# Patient Record
Sex: Female | Born: 1980 | Hispanic: Yes | Marital: Married | Smoking: Never smoker
Health system: Southern US, Community
[De-identification: ages and names within clinical notes are randomized; demographics above are authoritative.]

## PROBLEM LIST (undated history)

## (undated) DIAGNOSIS — Z789 Other specified health status: Secondary | ICD-10-CM

## (undated) HISTORY — PX: NO PAST SURGERIES: SHX2092

---

## 2002-04-04 ENCOUNTER — Inpatient Hospital Stay (HOSPITAL_COMMUNITY): Admission: AD | Admit: 2002-04-04 | Discharge: 2002-04-04 | Payer: Self-pay | Admitting: *Deleted

## 2002-08-19 ENCOUNTER — Ambulatory Visit (HOSPITAL_COMMUNITY): Admission: RE | Admit: 2002-08-19 | Discharge: 2002-08-19 | Payer: Self-pay | Admitting: *Deleted

## 2002-09-10 ENCOUNTER — Ambulatory Visit (HOSPITAL_COMMUNITY): Admission: RE | Admit: 2002-09-10 | Discharge: 2002-09-10 | Payer: Self-pay | Admitting: *Deleted

## 2002-11-25 ENCOUNTER — Inpatient Hospital Stay (HOSPITAL_COMMUNITY): Admission: AD | Admit: 2002-11-25 | Discharge: 2002-11-27 | Payer: Self-pay | Admitting: *Deleted

## 2002-11-25 ENCOUNTER — Encounter (INDEPENDENT_AMBULATORY_CARE_PROVIDER_SITE_OTHER): Payer: Self-pay | Admitting: *Deleted

## 2010-02-10 ENCOUNTER — Ambulatory Visit (HOSPITAL_COMMUNITY): Admission: RE | Admit: 2010-02-10 | Discharge: 2010-02-10 | Payer: Self-pay | Admitting: Family Medicine

## 2010-07-16 ENCOUNTER — Ambulatory Visit: Payer: Self-pay | Admitting: Obstetrics & Gynecology

## 2010-07-22 ENCOUNTER — Ambulatory Visit: Payer: Self-pay | Admitting: Advanced Practice Midwife

## 2010-07-22 ENCOUNTER — Inpatient Hospital Stay (HOSPITAL_COMMUNITY): Admission: RE | Admit: 2010-07-22 | Discharge: 2010-07-24 | Payer: Self-pay | Admitting: Obstetrics & Gynecology

## 2011-02-03 LAB — CBC
HCT: 35.7 % — ABNORMAL LOW (ref 36.0–46.0)
Hemoglobin: 12.1 g/dL (ref 12.0–15.0)
MCH: 32.1 pg (ref 26.0–34.0)
MCHC: 33.8 g/dL (ref 30.0–36.0)
MCV: 94.9 fL (ref 78.0–100.0)
Platelets: 236 10*3/uL (ref 150–400)
RBC: 3.76 MIL/uL — ABNORMAL LOW (ref 3.87–5.11)
RDW: 15.1 % (ref 11.5–15.5)
WBC: 10.8 10*3/uL — ABNORMAL HIGH (ref 4.0–10.5)

## 2011-02-03 LAB — RPR: RPR Ser Ql: NONREACTIVE

## 2013-11-21 NOTE — L&D Delivery Note (Signed)
Attestation of Attending Supervision of Advanced Practitioner (CNM/NP): Evaluation and management procedures were performed by the Advanced Practitioner under my supervision and collaboration.  I have reviewed the Advanced Practitioner's note and chart, and I agree with the management and plan.  Alexandria Villa 09/17/2014 3:22 PM

## 2013-11-21 NOTE — L&D Delivery Note (Signed)
Delivery Note Once pt's contractions pattern improved, she progressed steadily to C/C/+3.  After a 2 contraction 2nd stage, at 0238 a viable female was delivered via  (Presentation: LOA ).  APGAR: 9/9 ; weight pending.   .   Placenta status: intact with 3Vcord:  with the following complications: none   Anesthesia: Epidural  Episiotomy: none Lacerations: none Suture Repair: n/a Est. Blood Loss (mL): 200  Mom to postpartum.  Baby to Couplet care / Skin to Skin.  Delivery by Kathleen LimeShaniquah [name], SNM under my direct supervision.  CRESENZO-DISHMAN,Kyona Chauncey 09/12/2014, 2:42 AM

## 2014-04-10 ENCOUNTER — Other Ambulatory Visit (HOSPITAL_COMMUNITY): Payer: Self-pay | Admitting: Nurse Practitioner

## 2014-04-10 DIAGNOSIS — Z0379 Encounter for other suspected maternal and fetal conditions ruled out: Secondary | ICD-10-CM

## 2014-04-10 LAB — OB RESULTS CONSOLE RPR: RPR: NONREACTIVE

## 2014-04-10 LAB — OB RESULTS CONSOLE GBS: STREP GROUP B AG: NEGATIVE

## 2014-04-10 LAB — OB RESULTS CONSOLE HEPATITIS B SURFACE ANTIGEN: Hepatitis B Surface Ag: NEGATIVE

## 2014-04-10 LAB — OB RESULTS CONSOLE GC/CHLAMYDIA
CHLAMYDIA, DNA PROBE: NEGATIVE
GC PROBE AMP, GENITAL: NEGATIVE

## 2014-04-10 LAB — OB RESULTS CONSOLE HIV ANTIBODY (ROUTINE TESTING): HIV: NONREACTIVE

## 2014-04-10 LAB — OB RESULTS CONSOLE RUBELLA ANTIBODY, IGM: RUBELLA: IMMUNE

## 2014-04-10 LAB — OB RESULTS CONSOLE ANTIBODY SCREEN: Antibody Screen: NEGATIVE

## 2014-04-16 ENCOUNTER — Ambulatory Visit (HOSPITAL_COMMUNITY)
Admission: RE | Admit: 2014-04-16 | Discharge: 2014-04-16 | Disposition: A | Discharge status: Home / Self Care | Payer: Medicaid Other | Source: Ambulatory Visit | Attending: Nurse Practitioner | Admitting: Nurse Practitioner

## 2014-04-16 DIAGNOSIS — Z0379 Encounter for other suspected maternal and fetal conditions ruled out: Secondary | ICD-10-CM | POA: Diagnosis not present

## 2014-04-16 DIAGNOSIS — O368990 Maternal care for other specified fetal problems, unspecified trimester, not applicable or unspecified: Secondary | ICD-10-CM | POA: Diagnosis present

## 2014-04-16 DIAGNOSIS — O43899 Other placental disorders, unspecified trimester: Secondary | ICD-10-CM | POA: Diagnosis present

## 2014-06-27 ENCOUNTER — Other Ambulatory Visit (HOSPITAL_COMMUNITY): Payer: Self-pay | Admitting: Nurse Practitioner

## 2014-06-27 DIAGNOSIS — O3663X1 Maternal care for excessive fetal growth, third trimester, fetus 1: Secondary | ICD-10-CM

## 2014-07-02 ENCOUNTER — Ambulatory Visit (HOSPITAL_COMMUNITY)
Admission: RE | Admit: 2014-07-02 | Discharge: 2014-07-02 | Disposition: A | Discharge status: Home / Self Care | Payer: Self-pay | Source: Ambulatory Visit | Attending: Nurse Practitioner | Admitting: Nurse Practitioner

## 2014-07-02 DIAGNOSIS — O3663X1 Maternal care for excessive fetal growth, third trimester, fetus 1: Secondary | ICD-10-CM

## 2014-07-02 DIAGNOSIS — Z3689 Encounter for other specified antenatal screening: Secondary | ICD-10-CM | POA: Insufficient documentation

## 2014-07-02 DIAGNOSIS — O3660X0 Maternal care for excessive fetal growth, unspecified trimester, not applicable or unspecified: Secondary | ICD-10-CM | POA: Insufficient documentation

## 2014-07-04 ENCOUNTER — Other Ambulatory Visit (HOSPITAL_COMMUNITY): Payer: Self-pay | Admitting: Nurse Practitioner

## 2014-07-04 DIAGNOSIS — Z0374 Encounter for suspected problem with fetal growth ruled out: Secondary | ICD-10-CM

## 2014-07-30 ENCOUNTER — Ambulatory Visit (HOSPITAL_COMMUNITY)
Admission: RE | Admit: 2014-07-30 | Discharge: 2014-07-30 | Disposition: A | Discharge status: Home / Self Care | Payer: Self-pay | Source: Ambulatory Visit | Attending: Nurse Practitioner | Admitting: Nurse Practitioner

## 2014-07-30 DIAGNOSIS — Z0374 Encounter for suspected problem with fetal growth ruled out: Secondary | ICD-10-CM | POA: Insufficient documentation

## 2014-07-30 DIAGNOSIS — O3660X0 Maternal care for excessive fetal growth, unspecified trimester, not applicable or unspecified: Secondary | ICD-10-CM | POA: Insufficient documentation

## 2014-07-30 DIAGNOSIS — O3663X1 Maternal care for excessive fetal growth, third trimester, fetus 1: Secondary | ICD-10-CM

## 2014-07-30 DIAGNOSIS — Z3689 Encounter for other specified antenatal screening: Secondary | ICD-10-CM | POA: Insufficient documentation

## 2014-08-13 ENCOUNTER — Ambulatory Visit (HOSPITAL_COMMUNITY): Payer: Self-pay

## 2014-08-25 LAB — OB RESULTS CONSOLE GC/CHLAMYDIA
Chlamydia: NEGATIVE
Gonorrhea: NEGATIVE

## 2014-08-25 LAB — OB RESULTS CONSOLE GBS: STREP GROUP B AG: NEGATIVE

## 2014-08-27 ENCOUNTER — Ambulatory Visit (HOSPITAL_COMMUNITY): Payer: MEDICAID

## 2014-09-11 ENCOUNTER — Inpatient Hospital Stay (HOSPITAL_COMMUNITY): Payer: Medicaid Other | Admitting: Anesthesiology

## 2014-09-11 ENCOUNTER — Encounter (HOSPITAL_COMMUNITY): Payer: Self-pay | Admitting: *Deleted

## 2014-09-11 ENCOUNTER — Inpatient Hospital Stay (HOSPITAL_COMMUNITY)
Admission: AD | Admit: 2014-09-11 | Discharge: 2014-09-13 | DRG: 775 | Disposition: A | Discharge status: Home / Self Care | Payer: Medicaid Other | Source: Ambulatory Visit | Attending: Obstetrics & Gynecology | Admitting: Obstetrics & Gynecology

## 2014-09-11 ENCOUNTER — Encounter (HOSPITAL_COMMUNITY): Payer: Self-pay

## 2014-09-11 ENCOUNTER — Inpatient Hospital Stay (HOSPITAL_COMMUNITY)
Admission: AD | Admit: 2014-09-11 | Discharge: 2014-09-11 | Disposition: A | Discharge status: Home / Self Care | Payer: Medicaid Other | Source: Ambulatory Visit | Attending: Obstetrics and Gynecology | Admitting: Obstetrics and Gynecology

## 2014-09-11 ENCOUNTER — Encounter (HOSPITAL_COMMUNITY): Payer: Medicaid Other | Admitting: Anesthesiology

## 2014-09-11 DIAGNOSIS — IMO0001 Reserved for inherently not codable concepts without codable children: Secondary | ICD-10-CM

## 2014-09-11 DIAGNOSIS — O3663X0 Maternal care for excessive fetal growth, third trimester, not applicable or unspecified: Principal | ICD-10-CM | POA: Diagnosis present

## 2014-09-11 DIAGNOSIS — Z833 Family history of diabetes mellitus: Secondary | ICD-10-CM

## 2014-09-11 DIAGNOSIS — Z3A39 39 weeks gestation of pregnancy: Secondary | ICD-10-CM | POA: Diagnosis present

## 2014-09-11 DIAGNOSIS — O471 False labor at or after 37 completed weeks of gestation: Secondary | ICD-10-CM | POA: Diagnosis present

## 2014-09-11 DIAGNOSIS — N898 Other specified noninflammatory disorders of vagina: Secondary | ICD-10-CM

## 2014-09-11 DIAGNOSIS — O26893 Other specified pregnancy related conditions, third trimester: Secondary | ICD-10-CM

## 2014-09-11 HISTORY — DX: Other specified health status: Z78.9

## 2014-09-11 LAB — CBC
HCT: 35.4 % — ABNORMAL LOW (ref 36.0–46.0)
Hemoglobin: 11.7 g/dL — ABNORMAL LOW (ref 12.0–15.0)
MCH: 30.1 pg (ref 26.0–34.0)
MCHC: 33.1 g/dL (ref 30.0–36.0)
MCV: 91 fL (ref 78.0–100.0)
PLATELETS: 269 10*3/uL (ref 150–400)
RBC: 3.89 MIL/uL (ref 3.87–5.11)
RDW: 14.4 % (ref 11.5–15.5)
WBC: 13.7 10*3/uL — ABNORMAL HIGH (ref 4.0–10.5)

## 2014-09-11 LAB — TYPE AND SCREEN
ABO/RH(D): A POS
Antibody Screen: NEGATIVE

## 2014-09-11 LAB — ABO/RH: ABO/RH(D): A POS

## 2014-09-11 LAB — AMNISURE RUPTURE OF MEMBRANE (ROM) NOT AT ARMC: Amnisure ROM: NEGATIVE

## 2014-09-11 MED ORDER — LACTATED RINGERS IV SOLN: 500.0000 mL | Freq: Once | INTRAVENOUS | Status: DC

## 2014-09-11 MED ORDER — EPHEDRINE 5 MG/ML INJ
10.0000 mg | INTRAVENOUS | Status: DC | PRN
  Filled 2014-09-11: qty 2

## 2014-09-11 MED ORDER — PHENYLEPHRINE 40 MCG/ML (10ML) SYRINGE FOR IV PUSH (FOR BLOOD PRESSURE SUPPORT)
80.0000 ug | PREFILLED_SYRINGE | INTRAVENOUS | Status: DC | PRN
  Filled 2014-09-11: qty 2

## 2014-09-11 MED ORDER — LACTATED RINGERS IV SOLN: 500.0000 mL | INTRAVENOUS | Status: DC | PRN

## 2014-09-11 MED ORDER — LIDOCAINE HCL (PF) 1 % IJ SOLN
30.0000 mL | INTRAMUSCULAR | Status: DC | PRN
  Filled 2014-09-11: qty 30

## 2014-09-11 MED ORDER — CITRIC ACID-SODIUM CITRATE 334-500 MG/5ML PO SOLN: 30.0000 mL | ORAL | Status: DC | PRN

## 2014-09-11 MED ORDER — ONDANSETRON HCL 4 MG/2ML IJ SOLN: 4.0000 mg | Freq: Four times a day (QID) | INTRAMUSCULAR | Status: DC | PRN

## 2014-09-11 MED ORDER — OXYTOCIN 40 UNITS IN LACTATED RINGERS INFUSION - SIMPLE MED
62.5000 mL/h | INTRAVENOUS | Status: DC
  Filled 2014-09-11: qty 1000

## 2014-09-11 MED ORDER — FENTANYL 2.5 MCG/ML BUPIVACAINE 1/10 % EPIDURAL INFUSION (WH - ANES)
INTRAMUSCULAR | Status: AC
  Filled 2014-09-11: qty 125

## 2014-09-11 MED ORDER — ACETAMINOPHEN 325 MG PO TABS: 650.0000 mg | ORAL_TABLET | ORAL | Status: DC | PRN

## 2014-09-11 MED ORDER — FLEET ENEMA 7-19 GM/118ML RE ENEM: 1.0000 | ENEMA | RECTAL | Status: DC | PRN

## 2014-09-11 MED ORDER — TERBUTALINE SULFATE 1 MG/ML IJ SOLN: 0.2500 mg | Freq: Once | INTRAMUSCULAR | Status: AC | PRN

## 2014-09-11 MED ORDER — PHENYLEPHRINE 40 MCG/ML (10ML) SYRINGE FOR IV PUSH (FOR BLOOD PRESSURE SUPPORT)
PREFILLED_SYRINGE | INTRAVENOUS | Status: AC
  Filled 2014-09-11: qty 10

## 2014-09-11 MED ORDER — OXYTOCIN BOLUS FROM INFUSION
500.0000 mL | INTRAVENOUS | Status: DC
  Administered 2014-09-12: 500 mL via INTRAVENOUS

## 2014-09-11 MED ORDER — OXYTOCIN 40 UNITS IN LACTATED RINGERS INFUSION - SIMPLE MED
1.0000 m[IU]/min | INTRAVENOUS | Status: DC
  Administered 2014-09-11: 2 m[IU]/min via INTRAVENOUS

## 2014-09-11 MED ORDER — DIPHENHYDRAMINE HCL 50 MG/ML IJ SOLN
12.5000 mg | INTRAMUSCULAR | Status: AC | PRN
  Administered 2014-09-11 – 2014-09-12 (×3): 12.5 mg via INTRAVENOUS
  Filled 2014-09-11: qty 1

## 2014-09-11 MED ORDER — FENTANYL 2.5 MCG/ML BUPIVACAINE 1/10 % EPIDURAL INFUSION (WH - ANES)
14.0000 mL/h | INTRAMUSCULAR | Status: DC | PRN
  Administered 2014-09-11: 14 mL/h via EPIDURAL

## 2014-09-11 MED ORDER — OXYCODONE-ACETAMINOPHEN 5-325 MG PO TABS: 1.0000 | ORAL_TABLET | ORAL | Status: DC | PRN

## 2014-09-11 MED ORDER — OXYCODONE-ACETAMINOPHEN 5-325 MG PO TABS: 2.0000 | ORAL_TABLET | ORAL | Status: DC | PRN

## 2014-09-11 MED ORDER — LACTATED RINGERS IV SOLN
INTRAVENOUS | Status: DC
  Administered 2014-09-11: 23:00:00 via INTRAVENOUS

## 2014-09-11 MED ORDER — LIDOCAINE HCL (PF) 1 % IJ SOLN
INTRAMUSCULAR | Status: DC | PRN
  Administered 2014-09-11 (×4): 4 mL

## 2014-09-11 NOTE — Discharge Instructions (Signed)
Contracciones de Braxton Hicks °(Braxton Hicks Contractions) °Durante el embarazo, pueden presentarse contracciones uterinas que no siempre indican que está en trabajo de parto.  °¿QUÉ SON LAS CONTRACCIONES DE BRAXTON HICKS?  °Las contracciones que se presentan antes del trabajo de parto se conocen como contracciones de Braxton Hicks o falso trabajo de parto. Hacia el final del embarazo (32 a 34 semanas), estas contracciones pueden aparecen con más frecuencia y volverse más intensas. No corresponden al trabajo de parto verdadero porque estas contracciones no producen el agrandamiento (la dilatación) y el afinamiento del cuello del útero. Algunas veces, es difícil distinguirlas del trabajo de parto verdadero porque en algunos casos pueden ser muy intensas, y las personas tienen diferentes niveles de tolerancia al dolor. No debe sentirse avergonzada si concurre al hospital con falso trabajo de parto. En ocasiones, la única forma de saber si el trabajo de parto es verdadero es que el médico determine si hay cambios en el cuello del útero. °Si no hay problemas prenatales u otras complicaciones de salud asociadas con el embarazo, no habrá inconvenientes si la envían a su casa con falso trabajo de parto y espera que comience el verdadero. °CÓMO DIFERENCIAR EL TRABAJO DE PARTO FALSO DEL VERDADERO °Falso trabajo de parto °· Las contracciones del falso trabajo de parto duran menos y no son tan intensas como las verdaderas. °· Generalmente son irregulares. °· A menudo, se sienten en la parte delantera de la parte baja del abdomen y en la ingle, °· y pueden desaparecer cuando camina o cambia de posición mientras está acostada. °· Las contracciones se vuelven más débiles y su duración es menor a medida que el tiempo transcurre. °· Por lo general, no se hacen progresivamente más intensas, regulares y cercanas entre sí como en el caso del trabajo de parto verdadero. °Verdadero trabajo de parto °· Las contracciones del verdadero  trabajo de parto duran de 30 a 70 segundos, son muy regulares y suelen volverse más intensas, y aumenta su frecuencia. °· No desaparecen cuando camina. °· La molestia generalmente se siente en la parte superior del útero y se extiende hacia la zona inferior del abdomen y hacia la cintura. °· El médico podrá examinarla para determinar si el trabajo de parto es verdadero. El examen mostrará si el cuello del útero se está dilatando y afinando. °LO QUE DEBE RECORDAR °· Continúe haciendo los ejercicios habituales y siga otras indicaciones que el médico le dé. °· Tome todos los medicamentos como le indicó el médico. °· Concurra a las visitas prenatales regulares. °· Coma y beba con moderación si cree que está en trabajo de parto. °· Si las contracciones de Braxton Hicks le provocan incomodidad: °¨ Cambie de posición: si está acostada o descansando, camine; si está caminando, descanse. °¨ Siéntese y descanse en una bañera con agua tibia. °¨ Beba 2 o 3 vasos de agua. La deshidratación puede provocar contracciones. °¨ Respire lenta y profundamente varias veces por hora. °¿CUÁNDO DEBO BUSCAR ASISTENCIA MÉDICA INMEDIATA? °Solicite atención médica de inmediato si: °· Las contracciones se intensifican, se hacen más regulares y cercanas entre sí. °· Tiene una pérdida de líquido por la vagina. °· Tiene fiebre. °· Elimina mucosidad manchada con sangre. °· Tiene una hemorragia vaginal abundante. °· Tiene dolor abdominal permanente. °· Tiene un dolor en la zona lumbar que nunca tuvo antes. °· Siente que la cabeza del bebé empuja hacia abajo y ejerce presión en la zona pélvica. °· El bebé no se mueve tanto como solía. °Document Released: 08/17/2005 Document Revised: 11/12/2013 °ExitCare® Patient   Information 2015 [place], [place]. This information is not intended to replace advice given to you by your health care provider. Make sure you discuss any questions you have with your health care provider.  Evaluacin de los movimientos  fetales  (Fetal Movement Counts) Nombre del paciente: __________________________________________________ Micheline ChapmanFecha de parto estimada: ____________________ Caroleen HammanLa evaluacin de los movimientos fetales es muy recomendable en los embarazos de alto riesgo, pero tambin es una buena idea que lo hagan todas las Horseshoe Bendembarazadas. El Firefightermdico le indicar que comience a contarlos a las 28 semanas de Elvastonembarazo. Los movimientos fetales suelen aumentar:   Despus de Animatoruna comida completa.  Despus de la actividad fsica.  Despus de comer o beber Graybar Electricalgo dulce o fro.  En reposo. Preste atencin cuando sienta que el beb est ms activo. Esto le ayudar a notar un patrn de ciclos de vigilia y sueo de su beb y cules son los factores que contribuyen a un aumento de los movimientos fetales. Es importante llevar a cabo un recuento de movimientos fetales, al mismo tiempo cada da, cuando el beb normalmente est ms activo.  CMO CONTAR LOS MOVIMIENTOS FETALES 1. Busque un lugar tranquilo y cmodo para sentarse o recostarse sobre el lado izquierdo. Al recostarse sobre su lado izquierdo, le proporciona una mejor circulacin de Roslynsangre y oxgeno al beb. 2. Anote el da y la hora en una hoja de papel o en un diario. 3. Comience contando las pataditas, revoloteos, chasquidos, vueltas o pinchazos en un perodo de 2 horas. Debe sentir al menos 10 movimientos en 2 horas. 4. Si no siente 10 movimientos en 2 horas, espere 2  3 horas y cuente de nuevo. Busque cambios en el patrn o si no cuenta lo suficiente en 2 horas. SOLICITE ATENCIN MDICA SI:   Siente menos de 10 pataditas en 2 horas, en dos intentos.  No hay movimientos durante una hora.  El patrn se modifica o le lleva ms tiempo Art gallery managercada da contar las 10 pataditas.  Siente que el beb no se mueve como lo hace habitualmente. Fecha: ____________ Movimientos: ____________ Stevan BornHora de inicio: ____________ Stevan BornHora de finalizacin: ____________  Franco NonesFecha: ____________ Movimientos:  ____________ Stevan BornHora de inicio: ____________ Stevan BornHora de finalizacin: ____________  Franco NonesFecha: ____________ Movimientos: ____________ Stevan BornHora de inicio: ____________ Stevan BornHora de finalizacin: ____________  Franco NonesFecha: ____________ Movimientos: ____________ Stevan BornHora de inicio: ____________ Stevan BornHora de finalizacin: ____________  Franco NonesFecha: ____________ Movimientos: ____________ Stevan BornHora de inicio: ____________ Mammie RussianHora de finalizacin: ____________  Franco NonesFecha: ____________ Movimientos: ____________ Mammie RussianHora de inicio: ____________ Mammie RussianHora de finalizacin: ____________  Franco NonesFecha: ____________ Movimientos: ____________ Mammie RussianHora de inicio: ____________ Mammie RussianHora de finalizacin: ____________  Franco NonesFecha: ____________ Movimientos: ____________ Mammie RussianHora de inicio: ____________ Mammie RussianHora de finalizacin: ____________  Franco NonesFecha: ____________ Movimientos: ____________ Mammie RussianHora de inicio: ____________ Mammie RussianHora de finalizacin: ____________  Franco NonesFecha: ____________ Movimientos: ____________ Mammie RussianHora de inicio: ____________ Mammie RussianHora de finalizacin: ____________  Franco NonesFecha: ____________ Movimientos: ____________ Mammie RussianHora de inicio: ____________ Mammie RussianHora de finalizacin: ____________  Franco NonesFecha: ____________ Movimientos: ____________ Mammie RussianHora de inicio: ____________ Mammie RussianHora de finalizacin: ____________  Franco NonesFecha: ____________ Movimientos: ____________ Mammie RussianHora de inicio: ____________ Mammie RussianHora de finalizacin: ____________  Franco NonesFecha: ____________ Movimientos: ____________ Mammie RussianHora de inicio: ____________ Mammie RussianHora de finalizacin: ____________  Franco NonesFecha: ____________ Movimientos: ____________ Mammie RussianHora de inicio: ____________ Mammie RussianHora de finalizacin: ____________  Franco NonesFecha: ____________ Movimientos: ____________ Mammie RussianHora de inicio: ____________ Mammie RussianHora de finalizacin: ____________  Franco NonesFecha: ____________ Movimientos: ____________ Mammie RussianHora de inicio: ____________ Mammie RussianHora de finalizacin: ____________  Franco NonesFecha: ____________ Movimientos: ____________ Stevan BornHora de inicio: ____________ Mammie RussianHora de finalizacin: ____________  Franco NonesFecha: ____________ Movimientos: ____________ Mammie RussianHora de inicio: ____________  Mammie RussianHora de finalizacin: ____________  Franco NonesFecha:  ____________ Movimientos: ____________ Stevan BornHora de inicio: ____________ Stevan BornHora de finalizacin: ____________  Franco NonesFecha: ____________ Movimientos: ____________ Stevan BornHora de inicio: ____________ Stevan BornHora de finalizacin: ____________  Franco NonesFecha: ____________ Movimientos: ____________ Stevan BornHora de inicio: ____________ Stevan BornHora de finalizacin: ____________  Franco NonesFecha: ____________ Movimientos: ____________ Stevan BornHora de inicio: ____________ Stevan BornHora de finalizacin: ____________  Franco NonesFecha: ____________ Movimientos: ____________ Stevan BornHora de inicio: ____________ Stevan BornHora de finalizacin: ____________  Franco NonesFecha: ____________ Movimientos: ____________ Stevan BornHora de inicio: ____________ Mammie RussianHora de finalizacin: ____________  Franco NonesFecha: ____________ Movimientos: ____________ Mammie RussianHora de inicio: ____________ Mammie RussianHora de finalizacin: ____________  Franco NonesFecha: ____________ Movimientos: ____________ Mammie RussianHora de inicio: ____________ Mammie RussianHora de finalizacin: ____________  Franco NonesFecha: ____________ Movimientos: ____________ Mammie RussianHora de inicio: ____________ Mammie RussianHora de finalizacin: ____________  Franco NonesFecha: ____________ Movimientos: ____________ Mammie RussianHora de inicio: ____________ Mammie RussianHora de finalizacin: ____________  Franco NonesFecha: ____________ Movimientos: ____________ Mammie RussianHora de inicio: ____________ Mammie RussianHora de finalizacin: ____________  Franco NonesFecha: ____________ Movimientos: ____________ Mammie RussianHora de inicio: ____________ Mammie RussianHora de finalizacin: ____________  Franco NonesFecha: ____________ Movimientos: ____________ Mammie RussianHora de inicio: ____________ Mammie RussianHora de finalizacin: ____________  Franco NonesFecha: ____________ Movimientos: ____________ Mammie RussianHora de inicio: ____________ Mammie RussianHora de finalizacin: ____________  Franco NonesFecha: ____________ Movimientos: ____________ Mammie RussianHora de inicio: ____________ Mammie RussianHora de finalizacin: ____________  Franco NonesFecha: ____________ Movimientos: ____________ Mammie RussianHora de inicio: ____________ Mammie RussianHora de finalizacin: ____________  Franco NonesFecha: ____________ Movimientos: ____________ Mammie RussianHora de inicio: ____________ Mammie RussianHora de finalizacin: ____________  Franco NonesFecha:  ____________ Movimientos: ____________ Mammie RussianHora de inicio: ____________ Mammie RussianHora de finalizacin: ____________  Franco NonesFecha: ____________ Movimientos: ____________ Mammie RussianHora de inicio: ____________ Mammie RussianHora de finalizacin: ____________  Franco NonesFecha: ____________ Movimientos: ____________ Mammie RussianHora de inicio: ____________ Mammie RussianHora de finalizacin: ____________  Franco NonesFecha: ____________ Movimientos: ____________ Mammie RussianHora de inicio: ____________ Mammie RussianHora de finalizacin: ____________  Franco NonesFecha: ____________ Movimientos: ____________ Mammie RussianHora de inicio: ____________ Mammie RussianHora de finalizacin: ____________  Franco NonesFecha: ____________ Movimientos: ____________ Mammie RussianHora de inicio: ____________ Mammie RussianHora de finalizacin: ____________  Franco NonesFecha: ____________ Movimientos: ____________ Mammie RussianHora de inicio: ____________ Mammie RussianHora de finalizacin: ____________  Franco NonesFecha: ____________ Movimientos: ____________ Mammie RussianHora de inicio: ____________ Mammie RussianHora de finalizacin: ____________  Franco NonesFecha: ____________ Movimientos: ____________ Mammie RussianHora de inicio: ____________ Mammie RussianHora de finalizacin: ____________  Franco NonesFecha: ____________ Movimientos: ____________ Mammie RussianHora de inicio: ____________ Mammie RussianHora de finalizacin: ____________  Franco NonesFecha: ____________ Movimientos: ____________ Mammie RussianHora de inicio: ____________ Mammie RussianHora de finalizacin: ____________  Franco NonesFecha: ____________ Movimientos: ____________ Mammie RussianHora de inicio: ____________ Mammie RussianHora de finalizacin: ____________  Franco NonesFecha: ____________ Movimientos: ____________ Mammie RussianHora de inicio: ____________ Mammie RussianHora de finalizacin: ____________  Franco NonesFecha: ____________ Movimientos: ____________ Mammie RussianHora de inicio: ____________ Mammie RussianHora de finalizacin: ____________  Franco NonesFecha: ____________ Movimientos: ____________ Mammie RussianHora de inicio: ____________ Mammie RussianHora de finalizacin: ____________  Franco NonesFecha: ____________ Movimientos: ____________ Mammie RussianHora de inicio: ____________ Mammie RussianHora de finalizacin: ____________  Franco NonesFecha: ____________ Movimientos: ____________ Mammie RussianHora de inicio: ____________ Mammie RussianHora de finalizacin: ____________  Franco NonesFecha: ____________ Movimientos: ____________ Mammie RussianHora  de inicio: ____________ Mammie RussianHora de finalizacin: ____________  Franco NonesFecha: ____________ Movimientos: ____________ Mammie RussianHora de inicio: ____________ Mammie RussianHora de finalizacin: ____________  Franco NonesFecha: ____________ Movimientos: ____________ Mammie RussianHora de inicio: ____________ Mammie RussianHora de finalizacin: ____________  Document Released: 02/14/2008 Document Revised: 10/24/2012 ExitCare Patient Information 2015 [place], [place]. This information is not intended to replace advice given to you by your health care provider. Make sure you discuss any questions you have with your health care provider.

## 2014-09-11 NOTE — Progress Notes (Signed)
Alexandria Villa is a 33 y.o. G3P2002 at 253w2d by ultrasound admitted for active labor  Subjective: Feeling well, no c/o offered.  Objective: BP 113/58  Pulse 67  Temp(Src) 98.6 F (37 C) (Oral)  Resp 18  Ht 5\' 1"  (1.549 m)  Wt 176 lb (79.833 kg)  BMI 33.27 kg/m2  SpO2 99%      FHT:  FHR: 140 bpm, variability: moderate,  accelerations:  Present,  decelerations:  Absent UC:   irregular, every 2-3 minutes SVE:   Dilation: 7 Effacement (%): 90 Station: -2 Exam by:: Rodena PietyFran Cresenzo, cnm AROM - blood-tinged fluid  Labs: Lab Results  Component Value Date   WBC 13.7* 09/11/2014   HGB 11.7* 09/11/2014   HCT 35.4* 09/11/2014   MCV 91.0 09/11/2014   PLT 269 09/11/2014    Assessment / Plan: Spontaneous labor, progressing normally  Labor: Progressing normally Preeclampsia:  no signs or symptoms of toxicity Fetal Wellbeing:  Category I Pain Control:  Labor support without medications I/D:  n/a Anticipated MOD:  NSVD  [name],[name] 09/11/2014, 9:40 PM  I have seen and examined this patient and agree the above assessment. CRESENZO-DISHMAN,Korayma Hagwood 09/11/2014 11:52 PM

## 2014-09-11 NOTE — Anesthesia Procedure Notes (Signed)
Epidural Patient location during procedure: OB Start time: 09/11/2014 7:33 PM  Staffing Anesthesiologist: Lenita Peregrina Performed by: anesthesiologist   Preanesthetic Checklist Completed: patient identified, site marked, surgical consent, pre-op evaluation, timeout performed, IV checked, risks and benefits discussed and monitors and equipment checked  Epidural Patient position: sitting Prep: site prepped and draped and DuraPrep Patient monitoring: continuous pulse ox and blood pressure Approach: midline Location: L3-L4 Injection technique: LOR air  Needle:  Needle type: Tuohy  Needle gauge: 17 G Needle length: 9 cm and 9 Needle insertion depth: 5 cm cm Catheter type: closed end flexible Catheter size: 19 Gauge Catheter at skin depth: 10 cm Test dose: negative  Assessment Events: blood not aspirated, injection not painful, no injection resistance, negative IV test and no paresthesia  Additional Notes Discussed risk of headache, infection, bleeding, nerve injury and failed or incomplete block.  Patient voices understanding and wishes to proceed.  Epidural placed easily on first attempt.  No paresthesia.  Patient tolerated procedure well with no apparent complications.  Alexandria DecemberA> Alexandria Villa, MDReason for block:procedure for pain

## 2014-09-11 NOTE — MAU Note (Signed)
Patient states she had leaking of clear fluid at 0730 this am prior to urinating but has not had any since that time. States she has been having irregular contractions and reports good fetal movement. [facility], fern was negative and sent to MAU for amnisure.

## 2014-09-11 NOTE — Progress Notes (Signed)
   Charlies Silversrika Hernandez-Perez is a 33 y.o. G3P2002 at 526w2d  admitted for active labor  Subjective: Comfortable with epidural  Objective: Filed Vitals:   09/11/14 2100 09/11/14 2130 09/11/14 2200 09/11/14 2230  BP: 113/58 111/60 99/57 117/68  Pulse: 67 83 78 75  Temp:  98.2 F (36.8 C)    TempSrc:  Oral    Resp:      Height:      Weight:      SpO2:          FHT:  FHR: 120 bpm, variability: moderate,  accelerations:  Present,  decelerations:  Absent UC:   irregular, every 2-5 minutes SVE:   7/90/-2, no change since AROM Labs: Lab Results  Component Value Date   WBC 13.7* 09/11/2014   HGB 11.7* 09/11/2014   HCT 35.4* 09/11/2014   MCV 91.0 09/11/2014   PLT 269 09/11/2014    Assessment / Plan: Protracted active phase, will augment with pitocin  Labor: stalled out Fetal Wellbeing:  Category I Pain Control:  Epidural Anticipated MOD:  NSVD  CRESENZO-DISHMAN,Amadea Keagy 09/11/2014, 11:09 PM

## 2014-09-11 NOTE — MAU Note (Signed)
Pt states contractions started getting stronger around 4pm. Pt states she has been having contractions all day

## 2014-09-11 NOTE — H&P (Signed)
Alexandria Villa is a 33 y.o. female presenting for contractions for the past 5-6 hours. She was evaluated for LOF earlier today; however fern and amnisure were negative at that time. +FM, denies VB. Would like epidural.   Pt plans to breast and bottle feed. Would like IUD for bc.  Maternal Medical History:  Reason for admission: Contractions.  Nausea.  Contractions: Onset was 3-5 hours ago.   Frequency: regular.   Perceived severity is moderate.    Fetal activity: Perceived fetal activity is normal.   Last perceived fetal movement was within the past hour.     Prenatal Complications --US Large for dates  OB History   Grav Para Term Preterm Abortions TAB SAB Ect Mult Living   3 2 2       2      Past Medical History  Diagnosis Date  . Medical history non-contributory    Past Surgical History  Procedure Laterality Date  . No past surgeries     Family History: family history includes Diabetes in her mother. Social History:  reports that she has never smoked. She does not have any smokeless tobacco history on file. She reports that she does not drink alcohol or use illicit drugs.   Prenatal Transfer Tool  Maternal Diabetes: No Genetic Screening: Normal Maternal Ultrasounds/Referrals: Abnormal:  Findings:   Other: Large for GA Fetal Ultrasounds or other Referrals:  None Maternal Substance Abuse:  No Significant Maternal Medications:  None Significant Maternal Lab Results:  None Other Comments:  None  Review of Systems  Constitutional: Negative for fever and chills.  HENT: Negative for tinnitus.   Eyes: Negative for blurred vision.  Respiratory: Negative for cough.   Cardiovascular: Negative for chest pain and palpitations.  Gastrointestinal: Negative for nausea, vomiting and diarrhea.  Genitourinary: Negative for dysuria.  Skin: Negative for rash.  Neurological: Negative for dizziness and headaches.  Psychiatric/Behavioral: Negative for depression and substance  abuse.     Blood pressure 117/62, pulse 73, temperature 98.9 F (37.2 C), temperature source Oral, resp. rate 18, SpO2 99.00%. Maternal Exam:  Uterine Assessment: Contraction strength is moderate.  Contraction frequency is regular.   Abdomen: Fetal presentation: vertex  Cervix: Cervix evaluated by digital exam.     Fetal Exam Fetal Monitor Review: Baseline rate: 140.  Variability: moderate (6-25 bpm).   Pattern: accelerations present and no decelerations.    Fetal State Assessment: Category I - tracings are normal.     Physical Exam  Constitutional: She is oriented to person, place, and time. She appears well-developed and well-nourished.  HENT:  Head: Normocephalic.  Eyes: EOM are normal. Pupils are equal, round, and reactive to light.  Neck: No thyromegaly present.  Cardiovascular: Normal rate.   Respiratory: Effort normal.  Lymphadenopathy:    She has no cervical adenopathy.  Neurological: She is alert and oriented to person, place, and time. She has normal reflexes.  Skin: Skin is warm.  Psychiatric: She has a normal mood and affect.    Dilation: 7 Effacement (%): 80 Cervical Position: Middle Station: -2 Presentation: Vertex Exam by:: Gwen PoundsKari Koch, MD   Prenatal labs: ABO, Rh:  A Pos  Antibody:  Negative  Rubella:  Immune RPR:   Negative HBsAg:   Negative HIV:   Negative GBS:   Negative  Assessment/Plan: Patient is 33 y.o. [id number] 3934w2d who presents in active labor.  #Active labor of term gestation --Admit to birthing suites on L&D --IV access --Epidural if desired --Thin clear diet --Continuous FHR monitoring --  Possible LGA    Aldona BarKoch, Kari L 09/11/2014, 6:34 PM  I was consulted RE: POC and agree with above.  PorcupineVirginia Ladd Cen, Pennsylvania[place]ode IslandCNM 09/11/2014 8:17 PM

## 2014-09-11 NOTE — MAU Provider Note (Signed)
  History     CSN: [id number]  Arrival date and time: 09/11/14 1022   First Provider Initiated Contact with Patient 09/11/14 1208      Chief Complaint  Patient presents with  . Labor Eval   HPI Alexandria Villa is a 33 y.o. [id number] @ 6044w2d gestation who presents to the MAU for possible ROM. She states she started leaking fluid this morning about 7:30. [facility] Fern test and was sent to MAU for Amnisure. She complains of irregular contractions.   OB History   Grav Para Term Preterm Abortions TAB SAB Ect Mult Living   3 2 2       2       Past Medical History  Diagnosis Date  . Medical history non-contributory     Past Surgical History  Procedure Laterality Date  . No past surgeries      Family History  Problem Relation Age of Onset  . Diabetes Mother     History  Substance Use Topics  . Smoking status: Never Smoker   . Smokeless tobacco: Not on file  . Alcohol Use: No    Allergies: No Known Allergies  Prescriptions prior to admission  Medication Sig Dispense Refill  . Prenatal Vit-Fe Fumarate-FA (PRENATAL MULTIVITAMIN) TABS tablet Take 1 tablet by mouth daily at 12 noon.        Review of Systems  Genitourinary:       Leaking fluid, contractions  All other systems negative  Blood pressure 109/65, pulse 75, temperature 98.9 F (37.2 C), temperature source Oral, resp. rate 18, height 5' 2.25" (1.581 m), weight 176 lb (79.833 kg), SpO2 99.00%.  Physical Exam  Constitutional: She is oriented to person, place, and time. She appears well-developed and well-nourished. No distress.  HENT:  Head: Normocephalic and atraumatic.  Eyes: Conjunctivae and EOM are normal.  Neck: Neck supple.  Cardiovascular: Normal rate.   Respiratory: Effort normal.  GI: Soft. There is no tenderness.  Gravid consistent with dates  Genitourinary:  Dilation: 5 Effacement (%): 70 Station: -2 Presentation: Vertex Exam by:: Morrison Oldee Carter  RN   Musculoskeletal: Normal range of motion.  Neurological: She is alert and oriented to person, place, and time.  Skin: Skin is warm and dry.  Psychiatric: She has a normal mood and affect. Her behavior is normal.   Results for orders placed during the hospital encounter of 09/11/14 (from the past 24 hour(s))  AMNISURE RUPTURE OF MEMBRANE (ROM)     Status: None   Collection Time    09/11/14 12:05 PM      Result Value Ref Range   Amnisure ROM NEGATIVE      MAU Course  Procedures EFM tracing reviewed by Ivonne AndrewV. Smith CNM, Amni sure negative. Patient informed of results and plan of care.   Assessment and Plan  33 y.o. 2044w2d here for evaluation of possible rupture of membranes. Stable for discharge without ROM. Patient informed of results and plan of care.   Alexandria Villa 09/11/2014, 12:10 PM

## 2014-09-11 NOTE — MAU Note (Signed)
Patient states she is having contractions every 5 minutes with spotting. No leaking. Reports good fetal movement.

## 2014-09-11 NOTE — MAU Provider Note (Signed)
Attestation of Attending Supervision of Advanced Practitioner (CNM/NP): Evaluation and management procedures were performed by the Advanced Practitioner under my supervision and collaboration.  I have reviewed the Advanced Practitioner's note and chart, and I agree with the management and plan.  Pama Roskos 09/11/2014 2:26 PM

## 2014-09-11 NOTE — Progress Notes (Signed)
FHR 120's, reactive. Reviewed by Dorathy KinsmanVirginia Ariyel Jeangilles, CNM

## 2014-09-11 NOTE — Anesthesia Preprocedure Evaluation (Signed)
Anesthesia Evaluation  Patient identified by MRN, date of birth, ID band Patient awake    Reviewed: Allergy & Precautions, H&P , NPO status , Patient's Chart, lab work & pertinent test results, reviewed documented beta blocker date and time   History of Anesthesia Complications Negative for: history of anesthetic complications  Airway Mallampati: II TM Distance: >3 FB Neck ROM: full    Dental  (+) Loose Some loose molars:   Pulmonary neg pulmonary ROS,  breath sounds clear to auscultation        Cardiovascular negative cardio ROS  Rhythm:regular Rate:Normal     Neuro/Psych negative neurological ROS  negative psych ROS   GI/Hepatic negative GI ROS, Neg liver ROS,   Endo/Other  negative endocrine ROS  Renal/GU negative Renal ROS     Musculoskeletal   Abdominal   Peds  Hematology negative hematology ROS (+)   Anesthesia Other Findings   Reproductive/Obstetrics (+) Pregnancy                           Anesthesia Physical Anesthesia Plan  ASA: II  Anesthesia Plan: Epidural   Post-op Pain Management:    Induction:   Airway Management Planned:   Additional Equipment:   Intra-op Plan:   Post-operative Plan:   Informed Consent: I have reviewed the patients History and Physical, chart, labs and discussed the procedure including the risks, benefits and alternatives for the proposed anesthesia with the patient or authorized representative who has indicated his/her understanding and acceptance.     Plan Discussed with:   Anesthesia Plan Comments:         Anesthesia Quick Evaluation

## 2014-09-11 NOTE — H&P (Signed)
Attestation of Attending Supervision of Advanced Practitioner (PA/CNM/NP): Evaluation and management procedures were performed by the Advanced Practitioner under my supervision and collaboration.  I have reviewed the Advanced Practitioner's note and chart, and I agree with the management and plan.  Mahsa Hanser, MD, FACOG Attending Obstetrician & Gynecologist Faculty Practice, [facility]   

## 2014-09-12 ENCOUNTER — Encounter (HOSPITAL_COMMUNITY): Payer: Self-pay | Admitting: *Deleted

## 2014-09-12 DIAGNOSIS — Z3A39 39 weeks gestation of pregnancy: Secondary | ICD-10-CM

## 2014-09-12 LAB — RPR

## 2014-09-12 MED ORDER — IBUPROFEN 600 MG PO TABS
600.0000 mg | ORAL_TABLET | Freq: Four times a day (QID) | ORAL | Status: DC
  Administered 2014-09-12 – 2014-09-13 (×6): 600 mg via ORAL
  Filled 2014-09-12 (×6): qty 1

## 2014-09-12 MED ORDER — OXYCODONE-ACETAMINOPHEN 5-325 MG PO TABS: 1.0000 | ORAL_TABLET | ORAL | Status: DC | PRN

## 2014-09-12 MED ORDER — ONDANSETRON HCL 4 MG PO TABS: 4.0000 mg | ORAL_TABLET | ORAL | Status: DC | PRN

## 2014-09-12 MED ORDER — SIMETHICONE 80 MG PO CHEW: 80.0000 mg | CHEWABLE_TABLET | ORAL | Status: DC | PRN

## 2014-09-12 MED ORDER — MEASLES, MUMPS & RUBELLA VAC ~~LOC~~ INJ
0.5000 mL | INJECTION | Freq: Once | SUBCUTANEOUS | Status: DC
  Filled 2014-09-12: qty 0.5

## 2014-09-12 MED ORDER — BISACODYL 10 MG RE SUPP
10.0000 mg | Freq: Every day | RECTAL | Status: DC | PRN
Start: 2014-09-12 — End: 2014-09-13

## 2014-09-12 MED ORDER — ZOLPIDEM TARTRATE 5 MG PO TABS: 5.0000 mg | ORAL_TABLET | Freq: Every evening | ORAL | Status: DC | PRN

## 2014-09-12 MED ORDER — FLEET ENEMA 7-19 GM/118ML RE ENEM: 1.0000 | ENEMA | Freq: Every day | RECTAL | Status: DC | PRN

## 2014-09-12 MED ORDER — OXYTOCIN 40 UNITS IN LACTATED RINGERS INFUSION - SIMPLE MED: 62.5000 mL/h | INTRAVENOUS | Status: DC | PRN

## 2014-09-12 MED ORDER — DIBUCAINE 1 % RE OINT: 1.0000 "application " | TOPICAL_OINTMENT | RECTAL | Status: DC | PRN

## 2014-09-12 MED ORDER — SENNOSIDES-DOCUSATE SODIUM 8.6-50 MG PO TABS
2.0000 | ORAL_TABLET | ORAL | Status: DC
  Administered 2014-09-13: 2 via ORAL
  Filled 2014-09-12: qty 2

## 2014-09-12 MED ORDER — TETANUS-DIPHTH-ACELL PERTUSSIS 5-2.5-18.5 LF-MCG/0.5 IM SUSP: 0.5000 mL | Freq: Once | INTRAMUSCULAR | Status: DC

## 2014-09-12 MED ORDER — BENZOCAINE-MENTHOL 20-0.5 % EX AERO: 1.0000 "application " | INHALATION_SPRAY | CUTANEOUS | Status: DC | PRN

## 2014-09-12 MED ORDER — WITCH HAZEL-GLYCERIN EX PADS: 1.0000 "application " | MEDICATED_PAD | CUTANEOUS | Status: DC | PRN

## 2014-09-12 MED ORDER — METHYLERGONOVINE MALEATE 0.2 MG/ML IJ SOLN: 0.2000 mg | INTRAMUSCULAR | Status: DC | PRN

## 2014-09-12 MED ORDER — ONDANSETRON HCL 4 MG/2ML IJ SOLN: 4.0000 mg | INTRAMUSCULAR | Status: DC | PRN

## 2014-09-12 MED ORDER — DIPHENHYDRAMINE HCL 25 MG PO CAPS: 25.0000 mg | ORAL_CAPSULE | Freq: Four times a day (QID) | ORAL | Status: DC | PRN

## 2014-09-12 MED ORDER — FERROUS SULFATE 325 (65 FE) MG PO TABS
325.0000 mg | ORAL_TABLET | Freq: Two times a day (BID) | ORAL | Status: DC
  Administered 2014-09-12 – 2014-09-13 (×3): 325 mg via ORAL
  Filled 2014-09-12 (×3): qty 1

## 2014-09-12 MED ORDER — PRENATAL MULTIVITAMIN CH
1.0000 | ORAL_TABLET | Freq: Every day | ORAL | Status: DC
  Administered 2014-09-12 – 2014-09-13 (×2): 1 via ORAL
  Filled 2014-09-12 (×2): qty 1

## 2014-09-12 MED ORDER — METHYLERGONOVINE MALEATE 0.2 MG PO TABS: 0.2000 mg | ORAL_TABLET | ORAL | Status: DC | PRN

## 2014-09-12 MED ORDER — OXYCODONE-ACETAMINOPHEN 5-325 MG PO TABS: 2.0000 | ORAL_TABLET | ORAL | Status: DC | PRN

## 2014-09-12 MED ORDER — LANOLIN HYDROUS EX OINT: TOPICAL_OINTMENT | CUTANEOUS | Status: DC | PRN

## 2014-09-12 NOTE — Lactation Note (Signed)
This note was copied from the chart of Alexandria Villa. Lactation Consultation Note     Initial consult with this mom of a term baby, now 6712 hours old, and full term. This is mom's third time breast feeding. She was asking for formula, and saying the baby would not feed. The baby was dressed in sleeper, wrapped in 2 blankets and a polartec blanket, with a hat that was tied under his neck. i explained LEAD to mom, and the benefits of sin to skin, and assisted mom latching the baby. He latched well, with strong suckles.  Mom said her mom was the reason for al the heavy blankets.Lactation folder reviewed with mom. Mom knows to call with questions/concerns.  Patient Name: Alexandria Villa [id number]'s Date: 09/12/2014 Reason for consult: Initial assessment   Maternal Data Formula Feeding for Exclusion: No Has patient been taught Hand Expression?: Yes Does the patient have breastfeeding experience prior to this delivery?: Yes  Feeding Feeding Type: Breast Fed  LATCH Score/Interventions Latch: Grasps breast easily, tongue down, lips flanged, rhythmical sucking.  Audible Swallowing: A few with stimulation Intervention(s): Skin to skin;Hand expression  Type of Nipple: Everted at rest and after stimulation  Comfort (Breast/Nipple): Soft / non-tender     Hold (Positioning): Assistance needed to correctly position infant at breast and maintain latch. Intervention(s): Breastfeeding basics reviewed;Support Pillows;Position options  LATCH Score: 8  Lactation Tools Discussed/Used     Consult Status Consult Status: PRN Date: 09/13/14 Follow-up type: In-patient    Alfred LevinsLee, Joretta Eads Anne 09/12/2014, 2:59 PM

## 2014-09-12 NOTE — Progress Notes (Signed)
UR completed 

## 2014-09-12 NOTE — Anesthesia Postprocedure Evaluation (Signed)
  Anesthesia Post-op Note  Patient: Alexandria Villa  Procedure(s) Performed: * No procedures listed *  Patient Location: [facility]  Anesthesia Type:Epidural  Level of Consciousness: awake and alert   Airway and Oxygen Therapy: Patient Spontanous Breathing  Post-op Pain: mild  Post-op Assessment: Post-op Vital signs reviewed, Patient's Cardiovascular Status Stable, Respiratory Function Stable, No signs of Nausea or vomiting, Pain level controlled, No headache, No residual numbness and No residual motor weakness  Post-op Vital Signs: Reviewed  Last Vitals:  Filed Vitals:   09/12/14 0530  BP: 120/64  Pulse: 73  Temp: 36.6 C  Resp: 18    Complications: No apparent anesthesia complications

## 2014-09-12 NOTE — Plan of Care (Signed)
Problem: Phase II Progression Outcomes Goal: Progress activity as tolerated unless otherwise ordered Outcome: Completed/Met Date Met:  09/12/14 Tolerates ambulating well

## 2014-09-13 MED ORDER — IBUPROFEN 600 MG PO TABS: 600.0000 mg | ORAL_TABLET | Freq: Four times a day (QID) | ORAL | Status: AC | PRN

## 2014-09-13 NOTE — Discharge Instructions (Signed)
Postpartum Care After Vaginal Delivery  °After you deliver your newborn (postpartum period), the usual stay in the hospital is 24-72 hours. If there were problems with your labor or delivery, or if you have other medical problems, you might be in the hospital longer.  °While you are in the hospital, you will receive help and instructions on how to care for yourself and your newborn during the postpartum period.  °While you are in the hospital:  °Be sure to tell your nurses if you have pain or discomfort, as well as where you feel the pain and what makes the pain worse.  °If you had an incision made near your vagina (episiotomy) or if you had some tearing during delivery, the nurses may put ice packs on your episiotomy or tear. The ice packs may help to reduce the pain and swelling.  °If you are breastfeeding, you may feel uncomfortable contractions of your uterus for a couple of weeks. This is normal. The contractions help your uterus get back to normal size.  °It is normal to have some bleeding after delivery.  °For the first 1-3 days after delivery, the flow is red and the amount may be similar to a period.  °It is common for the flow to start and stop.  °In the first few days, you may pass some small clots. Let your nurses know if you begin to pass large clots or your flow increases.  °Do not flush blood clots down the toilet before having the nurse look at them.  °During the next 3-10 days after delivery, your flow should become more watery and pink or brown-tinged in color.  °Ten to fourteen days after delivery, your flow should be a small amount of yellowish-white discharge.  °The amount of your flow will decrease over the first few weeks after delivery. Your flow may stop in 6-8 weeks. Most women have had their flow stop by 12 weeks after delivery. °You should change your sanitary pads frequently.  °Wash your hands thoroughly with soap and water for at least 20 seconds after changing pads, using the toilet,  or before holding or feeding your newborn.  °You should feel like you need to empty your bladder within the first 6-8 hours after delivery.  °In case you become weak, lightheaded, or faint, call your nurse before you get out of bed for the first time and before you take a shower for the first time.  °Within the first few days after delivery, your breasts may begin to feel tender and full. This is called engorgement. Breast tenderness usually goes away within 48-72 hours after engorgement occurs. You may also notice milk leaking from your breasts. If you are not breastfeeding, do not stimulate your breasts. Breast stimulation can make your breasts produce more milk.  °Spending as much time as possible with your newborn is very important. During this time, you and your newborn can feel close and get to know each other. Having your newborn stay in your room (rooming in) will help to strengthen the bond with your newborn. It will give you time to get to know your newborn and become comfortable caring for your newborn.  °Your hormones change after delivery. Sometimes the hormone changes can temporarily cause you to feel sad or tearful. These feelings should not last more than a few days. If these feelings last longer than that, you should talk to your caregiver.  °If desired, talk to your caregiver about methods of family planning or contraception.  °  Talk to your caregiver about immunizations. Your caregiver may want you to have the following immunizations before leaving the hospital:  °Tetanus, diphtheria, and pertussis (Tdap) or tetanus and diphtheria (Td) immunization. It is very important that you and your family (including grandparents) or others caring for your newborn are up-to-date with the Tdap or Td immunizations. The Tdap or Td immunization can help protect your newborn from getting ill.  °Rubella immunization.  °Varicella (chickenpox) immunization.  °Influenza immunization. You should receive this annual  immunization if you did not receive the immunization during your pregnancy. °Document Released: 09/04/2007 Document Revised: 08/01/2012 Document Reviewed: 07/04/2012  °Exit[employer]® Patient Information ©2015 [employer]. This information is not intended to replace advice given to you by your health care provider. Make sure you discuss any questions you have with your health care provider.  ° °Breastfeeding °Deciding to breastfeed is one of the best choices you can make for you and your baby. A change in hormones during pregnancy causes your breast tissue to grow and increases the number and size of your milk ducts. These hormones also allow proteins, sugars, and fats from your blood supply to make breast milk in your milk-producing glands. Hormones prevent breast milk from being released before your baby is born as well as prompt milk flow after birth. Once breastfeeding has begun, thoughts of your baby, as well as his or her sucking or crying, can stimulate the release of milk from your milk-producing glands.  °BENEFITS OF BREASTFEEDING °For Your Baby °· Your first milk (colostrum) helps your baby's digestive system function better.   °· There are antibodies in your milk that help your baby fight off infections.   °· Your baby has a lower incidence of asthma, allergies, and sudden infant death syndrome.   °· The nutrients in breast milk are better for your baby than infant formulas and are designed uniquely for your baby's needs.   °· Breast milk improves your baby's brain development.   °· Your baby is less likely to develop other conditions, such as childhood obesity, asthma, or type 2 diabetes mellitus.   °For You  °· Breastfeeding helps to create a very special bond between you and your baby.   °· Breastfeeding is convenient. Breast milk is always available at the correct temperature and costs nothing.   °· Breastfeeding helps to burn calories and helps you lose the weight gained during pregnancy.    °· Breastfeeding makes your uterus contract to its prepregnancy size faster and slows bleeding (lochia) after you give birth.   °· Breastfeeding helps to lower your risk of developing type 2 diabetes mellitus, osteoporosis, and breast or ovarian cancer later in life. °SIGNS THAT YOUR BABY IS HUNGRY °Early Signs of Hunger  °· Increased alertness or activity. °· Stretching. °· Movement of the head from side to side. °· Movement of the head and opening of the mouth when the corner of the mouth or cheek is stroked (rooting). °· Increased sucking sounds, smacking lips, cooing, sighing, or squeaking. °· Hand-to-mouth movements. °· Increased sucking of fingers or hands. °Late Signs of Hunger °· Fussing. °· Intermittent crying. °Extreme Signs of Hunger °Signs of extreme hunger will require calming and consoling before your baby will be able to breastfeed successfully. Do not wait for the following signs of extreme hunger to occur before you initiate breastfeeding:   °· Restlessness. °· A loud, strong cry. °·  Screaming. °BREASTFEEDING BASICS °Breastfeeding Initiation °· Find a comfortable place to sit or lie down, with your neck and back well supported. °· Place a pillow or   rolled up blanket under your baby to bring him or her to the level of your breast (if you are seated). Nursing pillows are specially designed to help support your arms and your baby while you breastfeed. °· Make sure that your baby's abdomen is facing your abdomen.   °· Gently massage your breast. With your fingertips, massage from your chest wall toward your nipple in a circular motion. This encourages milk flow. You may need to continue this action during the feeding if your milk flows slowly. °· Support your breast with 4 fingers underneath and your thumb above your nipple. Make sure your fingers are well away from your nipple and your baby's mouth.   °· Stroke your baby's lips gently with your finger or nipple.   °· When your baby's mouth is open  wide enough, quickly bring your baby to your breast, placing your entire nipple and as much of the colored area around your nipple (areola) as possible into your baby's mouth.   °¨ More areola should be visible above your baby's upper lip than below the lower lip.   °¨ Your baby's tongue should be between his or her lower gum and your breast.   °· Ensure that your baby's mouth is correctly positioned around your nipple (latched). Your baby's lips should create a seal on your breast and be turned out (everted). °· It is common for your baby to suck about 2-3 minutes in order to start the flow of breast milk. °Latching °Teaching your baby how to latch on to your breast properly is very important. An improper latch can cause nipple pain and decreased milk supply for you and poor weight gain in your baby. Also, if your baby is not latched onto your nipple properly, he or she may swallow some air during feeding. This can make your baby fussy. Burping your baby when you switch breasts during the feeding can help to get rid of the air. However, teaching your baby to latch on properly is still the best way to prevent fussiness from swallowing air while breastfeeding. °Signs that your baby has successfully latched on to your nipple:    °· Silent tugging or silent sucking, without causing you pain.   °· Swallowing heard between every 3-4 sucks.   °·  Muscle movement above and in front of his or her ears while sucking.   °Signs that your baby has not successfully latched on to nipple:  °· Sucking sounds or smacking sounds from your baby while breastfeeding. °· Nipple pain. °If you think your baby has not latched on correctly, slip your finger into the corner of your baby's mouth to break the suction and place it between your baby's gums. Attempt breastfeeding initiation again. °Signs of Successful Breastfeeding °Signs from your baby:   °· A gradual decrease in the number of sucks or complete cessation of sucking.   °· Falling  asleep.   °· Relaxation of his or her body.   °· Retention of a small amount of milk in his or her mouth.   °· Letting go of your breast by himself or herself. °Signs from you: °· Breasts that have increased in firmness, weight, and size 1-3 hours after feeding.   °· Breasts that are softer immediately after breastfeeding. °· Increased milk volume, as well as a change in milk consistency and color by the fifth day of breastfeeding.   °· Nipples that are not sore, cracked, or bleeding. °Signs That Your Baby is Getting Enough Milk °· Wetting at least 3 diapers in a 24-hour period. The urine should be clear and   pale yellow by age 5 days. °· At least 3 stools in a 24-hour period by age 5 days. The stool should be soft and yellow. °· At least 3 stools in a 24-hour period by age 7 days. The stool should be seedy and yellow. °· No loss of weight greater than 10% of birth weight during the first 3 days of age. °· Average weight gain of 4-7 ounces (113-198 g) per week after age 4 days. °· Consistent daily weight gain by age 5 days, without weight loss after the age of 2 weeks. °After a feeding, your baby may spit up a small amount. This is common. °BREASTFEEDING FREQUENCY AND DURATION °Frequent feeding will help you make more milk and can prevent sore nipples and breast engorgement. Breastfeed when you feel the need to reduce the fullness of your breasts or when your baby shows signs of hunger. This is called "breastfeeding on demand." Avoid introducing a pacifier to your baby while you are working to establish breastfeeding (the first 4-6 weeks after your baby is born). After this time you may choose to use a pacifier. Research has shown that pacifier use during the first year of a baby's life decreases the risk of sudden infant death syndrome (SIDS). °Allow your baby to feed on each breast as long as he or she wants. Breastfeed until your baby is finished feeding. When your baby unlatches or falls asleep while feeding from  the first breast, offer the second breast. Because newborns are often sleepy in the first few weeks of life, you may need to awaken your baby to get him or her to feed. °Breastfeeding times will vary from baby to baby. However, the following rules can serve as a guide to help you ensure that your baby is properly fed: °· Newborns (babies 4 weeks of age or younger) may breastfeed every 1-3 hours. °· Newborns should not go longer than 3 hours during the day or 5 hours during the night without breastfeeding. °· You should breastfeed your baby a minimum of 8 times in a 24-hour period until you begin to introduce solid foods to your baby at around 6 months of age. °BREAST MILK PUMPING °Pumping and storing breast milk allows you to ensure that your baby is exclusively fed your breast milk, even at times when you are unable to breastfeed. This is especially important if you are going back to work while you are still breastfeeding or when you are not able to be present during feedings. Your lactation consultant can give you guidelines on how long it is safe to store breast milk.  °A breast pump is a machine that allows you to pump milk from your breast into a sterile bottle. The pumped breast milk can then be stored in a refrigerator or freezer. Some breast pumps are operated by hand, while others use electricity. Ask your lactation consultant which type will work best for you. Breast pumps can be purchased, but some hospitals and breastfeeding support groups lease breast pumps on a monthly basis. A lactation consultant can teach you how to hand express breast milk, if you prefer not to use a pump.  °CARING FOR YOUR BREASTS WHILE YOU BREASTFEED °Nipples can become dry, cracked, and sore while breastfeeding. The following recommendations can help keep your breasts moisturized and healthy: °· Avoid using soap on your nipples.   °· Wear a supportive bra. Although not required, special nursing bras and tank tops are designed to  allow access to your breasts for   breastfeeding without taking off your entire bra or top. Avoid wearing underwire-style bras or extremely tight bras. °· Air dry your nipples for 3-4 minutes after each feeding.   °· Use only cotton bra pads to absorb leaked breast milk. Leaking of breast milk between feedings is normal.   °· Use lanolin on your nipples after breastfeeding. Lanolin helps to maintain your skin's normal moisture barrier. If you use pure lanolin, you do not need to wash it off before feeding your baby again. Pure lanolin is not toxic to your baby. You may also hand express a few drops of breast milk and gently massage that milk into your nipples and allow the milk to air dry. °In the first few weeks after giving birth, some women experience extremely full breasts (engorgement). Engorgement can make your breasts feel heavy, warm, and tender to the touch. Engorgement peaks within 3-5 days after you give birth. The following recommendations can help ease engorgement: °· Completely empty your breasts while breastfeeding or pumping. You may want to start by applying warm, moist heat (in the shower or with warm water-soaked hand towels) just before feeding or pumping. This increases circulation and helps the milk flow. If your baby does not completely empty your breasts while breastfeeding, pump any extra milk after he or she is finished. °· Wear a snug bra (nursing or regular) or tank top for 1-2 days to signal your body to slightly decrease milk production. °· Apply ice packs to your breasts, unless this is too uncomfortable for you. °· Make sure that your baby is latched on and positioned properly while breastfeeding. °If engorgement persists after 48 hours of following these recommendations, contact your health care provider or a lactation consultant. °OVERALL HEALTH CARE RECOMMENDATIONS WHILE BREASTFEEDING °· Eat healthy foods. Alternate between meals and snacks, eating 3 of each per day. Because what you  eat affects your breast milk, some of the foods may make your baby more irritable than usual. Avoid eating these foods if you are sure that they are negatively affecting your baby. °· Drink milk, fruit juice, and water to satisfy your thirst (about 10 glasses a day).   °· Rest often, relax, and continue to take your prenatal vitamins to prevent fatigue, stress, and anemia. °· Continue breast self-awareness checks. °· Avoid chewing and smoking tobacco. °· Avoid alcohol and drug use. °Some medicines that may be harmful to your baby can pass through breast milk. It is important to ask your health care provider before taking any medicine, including all over-the-counter and prescription medicine as well as vitamin and herbal supplements. °It is possible to become pregnant while breastfeeding. If birth control is desired, ask your health care provider about options that will be safe for your baby. °SEEK MEDICAL CARE IF:  °· You feel like you want to stop breastfeeding or have become frustrated with breastfeeding. °· You have painful breasts or nipples. °· Your nipples are cracked or bleeding. °· Your breasts are red, tender, or warm. °· You have a swollen area on either breast. °· You have a fever or chills. °· You have nausea or vomiting. °· You have drainage other than breast milk from your nipples. °· Your breasts do not become full before feedings by the fifth day after you give birth. °· You feel sad and depressed. °· Your baby is too sleepy to eat well. °· Your baby is having trouble sleeping.   °· Your baby is wetting less than 3 diapers in a 24-hour period. °· Your baby has   less than 3 stools in a 24-hour period. °· Your baby's skin or the white part of his or her eyes becomes yellow.   °· Your baby is not gaining weight by 5 days of age. °SEEK IMMEDIATE MEDICAL CARE IF:  °· Your baby is overly tired (lethargic) and does not want to wake up and feed. °· Your baby develops an unexplained fever. °Document Released:  11/07/2005 Document Revised: 11/12/2013 Document Reviewed: 05/01/2013 °[employer]® Patient Information ©2015 [employer]. This information is not intended to replace advice given to you by your health care provider. Make sure you discuss any questions you have with your health care provider. ° °

## 2014-09-13 NOTE — Progress Notes (Signed)
Discharge instructions provided to patient and significant other at bedside.  Activity, medications, when to call the doctor, follow up instructions and community resources discussed.  No questions at this time.  Patient left unit in stable condition with all personal belongings and infant in car seat to nursery for hugs tag removal.  Osvaldo AngstK. Reece Mcbroom, RN----------------

## 2014-09-13 NOTE — Discharge Summary (Signed)
Obstetric Discharge Summary Reason for Admission: onset of labor Prenatal Procedures: ultrasound Intrapartum Procedures: spontaneous vaginal delivery Postpartum Procedures: none Complications-Operative and Postpartum: none Eating, drinking, voiding, ambulating well.  +flatus.  Lochia and pain wnl.  Denies dizziness, lightheadedness, or sob. No complaints.   Hospital Course: Alexandria Villa is a 33 y.o. [id number] female admited at 39.2wks in active labor. She progressed quickly to SVB w/o complications. Her pp course has been uncomplicated.  By PPD#1 she is doing well, requests d/c, and is deemed to have received the full benefit of her hospital stay.  Filed Vitals:   09/13/14 0603  BP: 99/54  Pulse: 64  Temp: 98.2 F (36.8 C)  Resp: 17   H/H: Lab Results  Component Value Date/Time   HGB 11.7* 09/11/2014  6:30 PM   HCT 35.4* 09/11/2014  6:30 PM    Physical Exam: General: alert, cooperative and no distress Abdomen/Uterine Fundus: Appropriately tender, non-distended, FF @ U-2 Incision: n/a Lochia: appropriate Extremities: No evidence of DVT seen on physical exam. Negative Homan's sign, no cords, calf tenderness, or significant calf/ankle edema   Discharge Diagnoses: Term Pregnancy-delivered  Discharge Information: Date: 06/02/2011 Activity: pelvic rest Diet: routine  Medications: PNV and Ibuprofen Breast feeding: Yes Contraception: plans for Mirena Circumcision: outpatient Condition: stable Instructions: refer to handout Discharge to: home  Infant: Home with mother    Marge DuncansBooker, Gatha Mcnulty Randall, CNM, WHNP-BC 09/13/2014,10:19 AM

## 2014-09-22 ENCOUNTER — Encounter (HOSPITAL_COMMUNITY): Payer: Self-pay | Admitting: *Deleted

## 2016-05-06 IMAGING — US US OB FOLLOW-UP
2 series · 12 of 28 positions shown · non-contrast
Comparison: none

[Series 1: us ob follow up · 1 of 2 slices shown (1 of 2)]
[im 2/2]
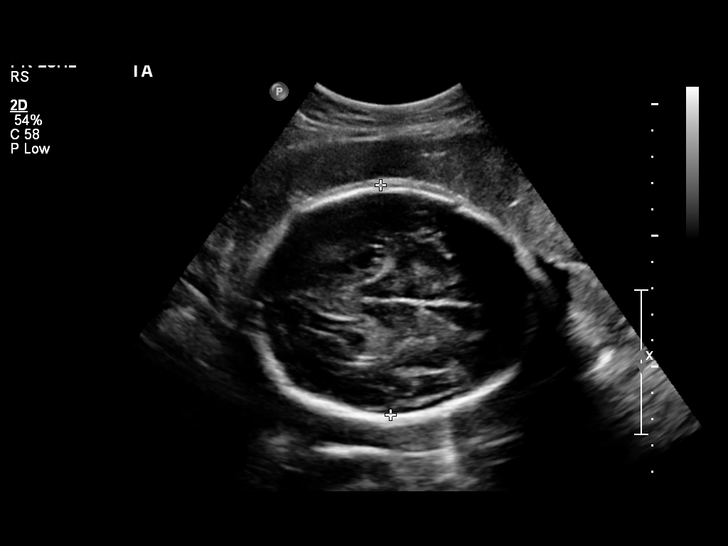

[Series 1: us ob follow up · 35 acquisitions, 11 frames shown (2 of 2)]
[im 2/35]
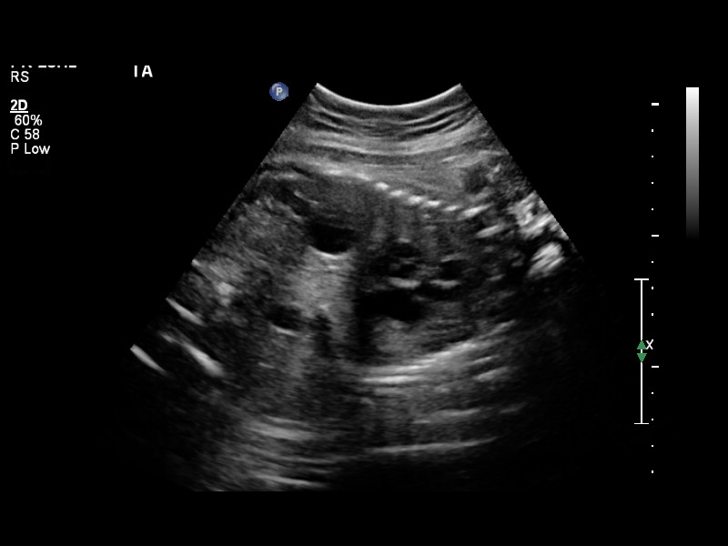
[im 5/35]
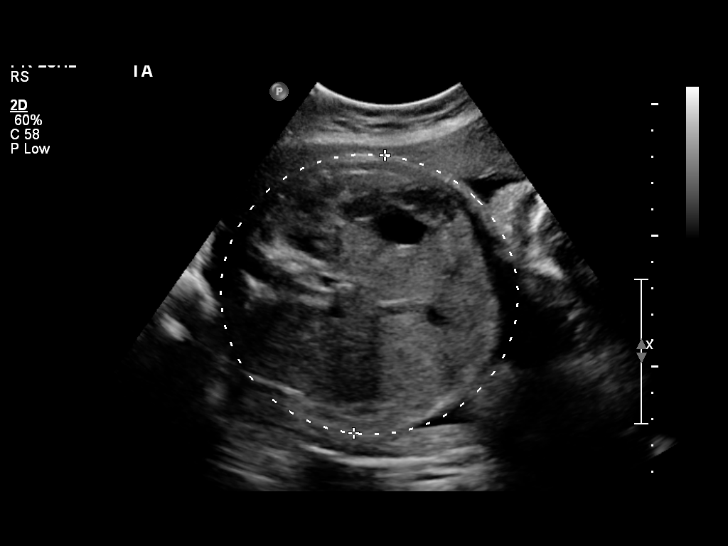
[im 9/35]
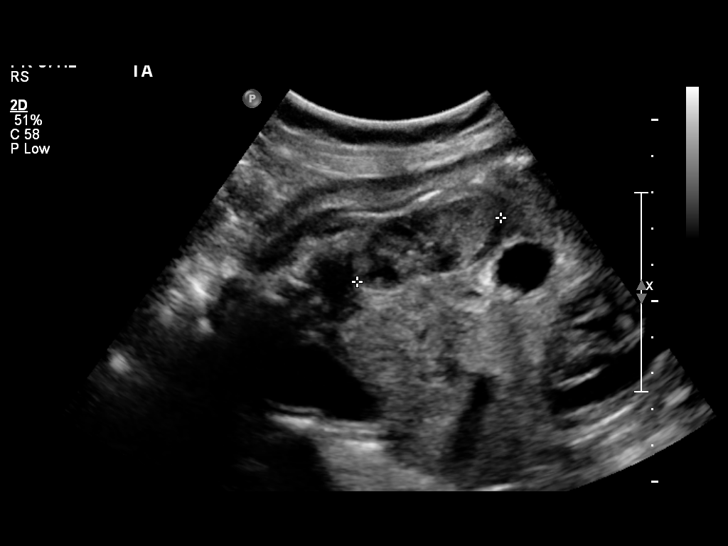
[im 11/35]
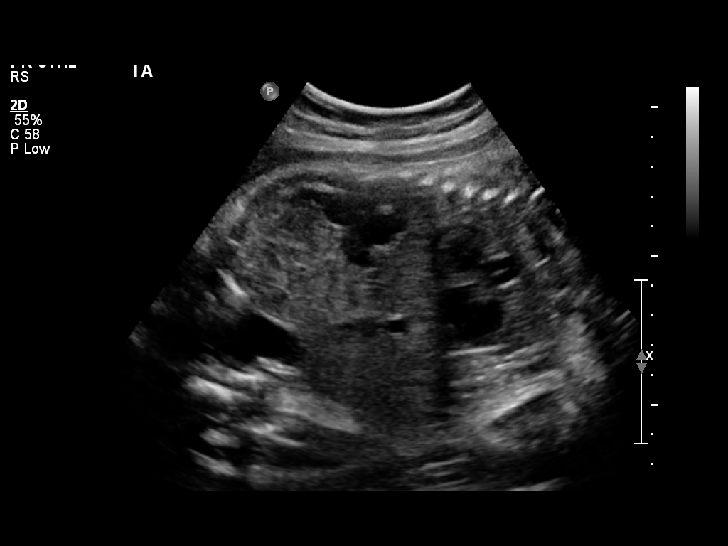
[im 14/35]
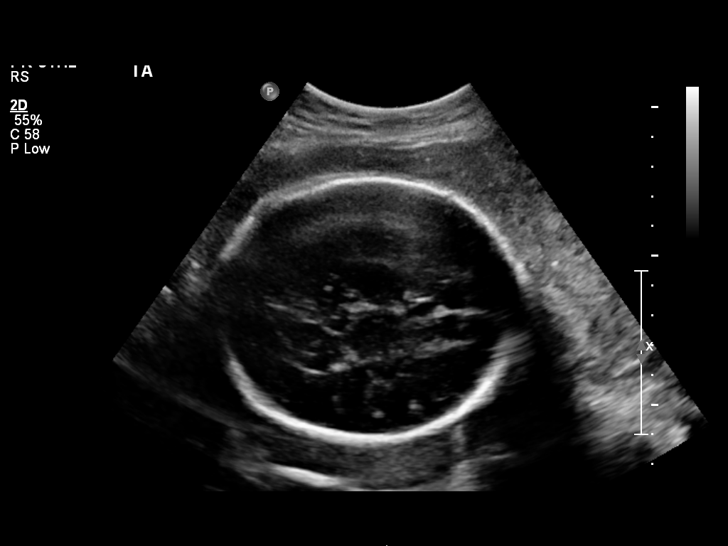
[im 18/35]
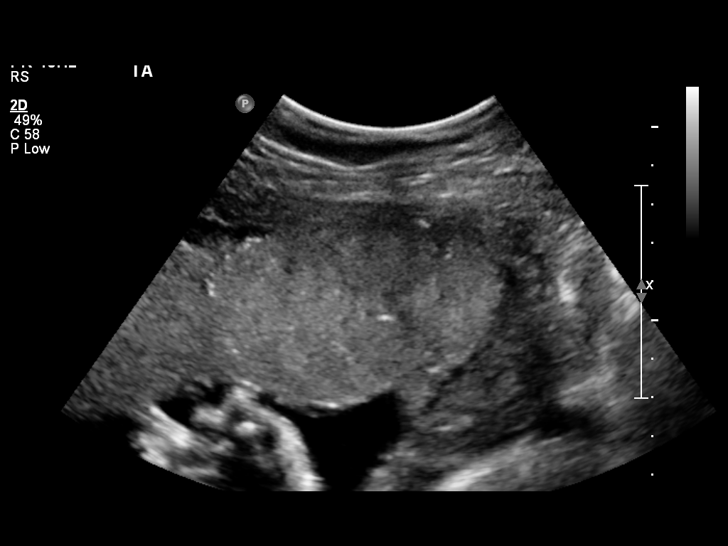
[im 21/35]
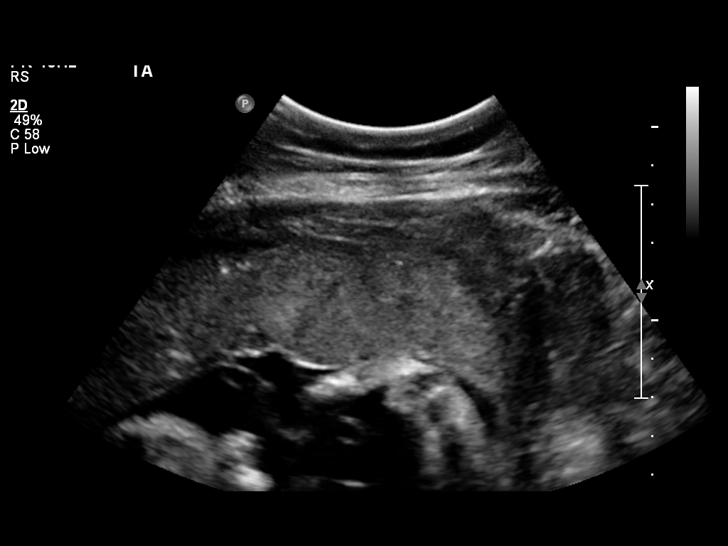
[im 24/35]
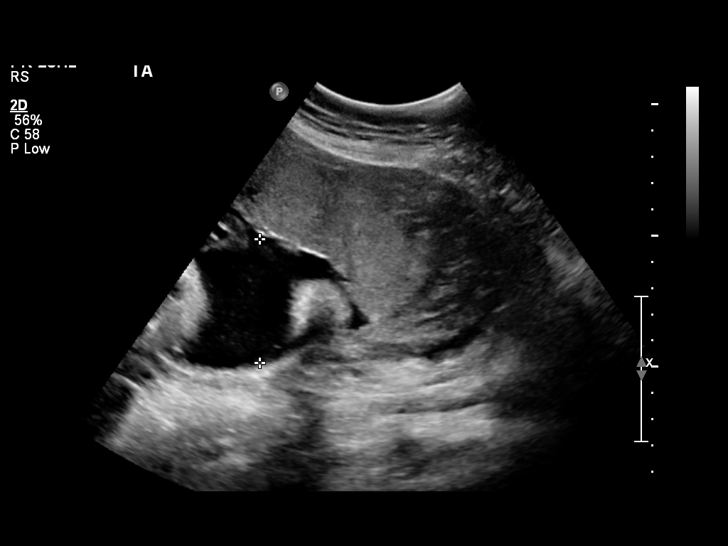
[im 28/35]
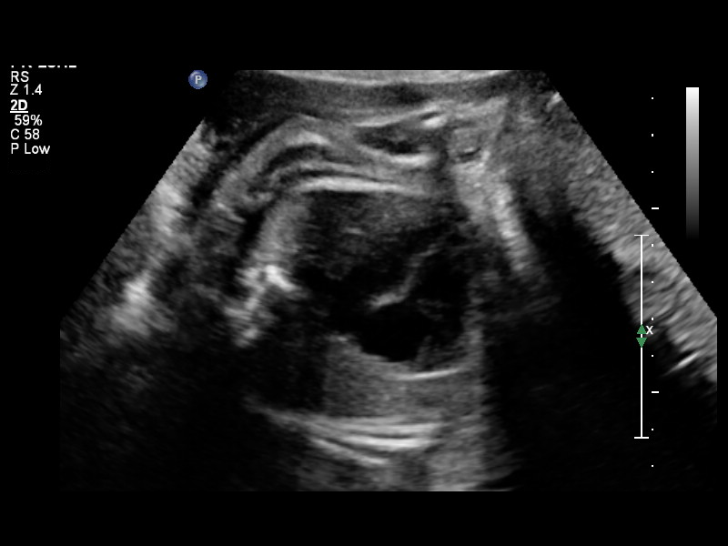
[im 30/35]
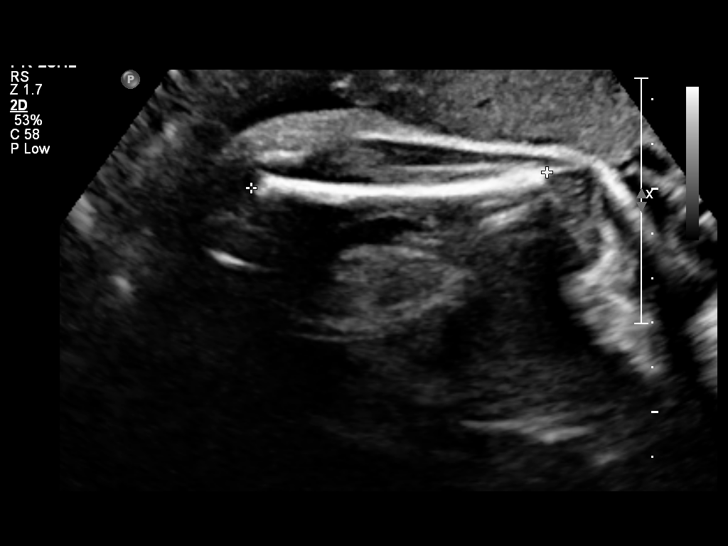
[im 33/35]
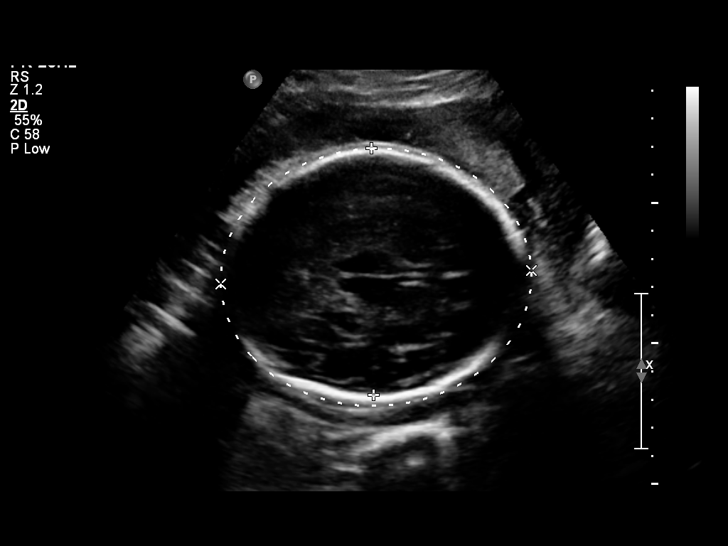

[12 of 28 positions shown; findings below may reference images not displayed]

OBSTETRICS REPORT
                      (Signed Final 07/30/2014 [DATE])

             [id number]

Service(s) Provided

 US OB FOLLOW UP                                       76816.1
Indications

 Size greater than dates (Large for gestational [AGE]
Fetal Evaluation

 Num Of Fetuses:    1
 Fetal Heart Rate:  150                          bpm
 Cardiac Activity:  Observed
 Presentation:      Cephalic
 Placenta:          Anterior, above cervical os
 P. Cord            Previously Visualized
 Insertion:

 Amniotic Fluid
 AFI FV:      Subjectively within normal limits
 AFI Sum:     16.43   cm       59  %Tile     Larg Pckt:    4.69  cm
 RUQ:   3.62    cm   RLQ:    3.78   cm    LUQ:   4.34    cm   LLQ:    4.69   cm
Biometry

 BPD:     86.6  mm     G. Age:  35w 0d                CI:         73.2   70 - 86
                                                      FL/HC:      20.6   19.9 -

 HC:     321.7  mm     G. Age:  36w 2d       89  %    HC/AC:      0.93   0.96 -

 AC:     344.5  mm     G. Age:  38w 2d     > 97  %    FL/BPD:     76.4   71 - 87
 FL:      66.2  mm     G. Age:  34w 1d       64  %    FL/AC:      19.2   20 - 24

 Est. FW:    2725  gm    6 lb 10 oz    > 90  %
Gestational Age

 LMP:           33w 1d        Date:  12/10/13                 EDD:   09/16/14
 U/S Today:     36w 0d                                        EDD:   08/27/14
 Best:          33w 1d     Det. By:  LMP  (12/10/13)          EDD:   09/16/14
Anatomy

 Cranium:          Appears normal         Aortic Arch:      Previously seen
 Fetal Cavum:      Previously seen        Ductal Arch:      Previously seen
 Ventricles:       Appears normal         Diaphragm:        Appears normal
 Choroid Plexus:   Previously seen        Stomach:          Appears normal, left
                                                            sided
 Cerebellum:       Previously seen        Abdomen:          Appears normal
 Posterior Fossa:  Previously seen        Abdominal Wall:   Previously seen
 Nuchal Fold:      Previously seen        Cord Vessels:     Previously seen
 Face:             Orbits and profile     Kidneys:          Appear normal
                   previously seen
 Lips:             Previously seen        Bladder:          Appears normal
 Heart:            Appears normal         Spine:            Previously seen
                   (4CH, axis, and
                   situs)
 RVOT:             Previously seen        Lower             Previously seen
                                          Extremities:
 LVOT:             Previously seen        Upper             Previously seen
                                          Extremities:

 Other:  Fetus appears to be a male. Heels and 5th digit previously seen.
         Technically difficult due to advanced GA and fetal position.
Cervix Uterus Adnexa

 Cervix:       Not visualized (advanced GA >94wks)
Impression

 Single IUP at 33w 1d
 Normal interval anatomy (limited)
 The estimated fetal weight today is > 90th %tile.  The AC
 measures > 97th %tile.
 Anterior placenta
 Normal amniotic fluid volume
Recommendations

 Recommend follow-up ultrasound examination in 3-4 weeks
 for interval growth.
 Consider repeating diabetes screen if not recently performed.

 [facility] with us.  Please do not hesitate to

## 2016-09-23 ENCOUNTER — Encounter (HOSPITAL_COMMUNITY): Payer: Self-pay

## 2018-04-20 ENCOUNTER — Other Ambulatory Visit (HOSPITAL_COMMUNITY): Payer: Self-pay | Admitting: *Deleted

## 2018-04-20 DIAGNOSIS — N644 Mastodynia: Secondary | ICD-10-CM

## 2018-05-08 ENCOUNTER — Other Ambulatory Visit: Payer: Self-pay

## 2018-05-08 ENCOUNTER — Ambulatory Visit (HOSPITAL_COMMUNITY): Payer: Self-pay

## 2018-06-14 ENCOUNTER — Ambulatory Visit: Admission: RE | Admit: 2018-06-14 | Payer: Self-pay | Source: Ambulatory Visit

## 2018-06-14 ENCOUNTER — Encounter (HOSPITAL_COMMUNITY): Payer: Self-pay

## 2018-06-14 ENCOUNTER — Ambulatory Visit: Payer: Self-pay

## 2018-06-14 ENCOUNTER — Ambulatory Visit
Admission: RE | Admit: 2018-06-14 | Discharge: 2018-06-14 | Disposition: A | Discharge status: Home / Self Care | Payer: No Typology Code available for payment source | Source: Ambulatory Visit | Attending: Obstetrics and Gynecology | Admitting: Obstetrics and Gynecology

## 2018-06-14 ENCOUNTER — Ambulatory Visit (HOSPITAL_COMMUNITY)
Admission: RE | Admit: 2018-06-14 | Discharge: 2018-06-14 | Disposition: A | Discharge status: Home / Self Care | Payer: Self-pay | Source: Ambulatory Visit | Attending: Obstetrics and Gynecology | Admitting: Obstetrics and Gynecology

## 2018-06-14 VITALS — BP 112/72

## 2018-06-14 DIAGNOSIS — N644 Mastodynia: Secondary | ICD-10-CM

## 2018-06-14 DIAGNOSIS — Z1239 Encounter for other screening for malignant neoplasm of breast: Secondary | ICD-10-CM

## 2018-06-14 NOTE — Patient Instructions (Signed)
Explained breast self awareness with Charlies SilversErika Hernandez-Perez. Patient did not need a Pap smear today due to last Pap smear was 09/23/2016. Let her know [facility] will cover Pap smears every 3 years unless has a history of abnormal Pap smears. [facility]. Appointment scheduled for Thursday, June 14, 2018 at 0920. Charlies SilversErika Hernandez-Perez verbalized understanding.  Hermenia Fritcher, Kathaleen Maserhristine Poll, RN 8:35 AM

## 2018-06-14 NOTE — Progress Notes (Signed)
Complaints of bilateral breast pain x 2 months that is greater within the left breast. Patient states the pain comes and goes. Patient rates the left breast pain at a 6 out of 10 and the right breast pain at a 2 out of 10. Patient stated that her left breast has increased in size over the past two months.   Pap Smear: Pap smear not completed today. [facility] and normal. Per patient has history of an abnormal Pap smear 3 years ago that a repeat Pap smear was completed for follow-up. [facility]. Last Pap smear result is in Epic.  Physical exam: Breasts Breasts symmetrical. No skin abnormalities bilateral breasts. No nipple retraction bilateral breasts. No nipple discharge bilateral breasts. No lymphadenopathy. No lumps palpated bilateral breasts. Complaints of left upper and nipple tenderness on exam. Complaints of right outer breast tenderness on exam. [facility]. Appointment scheduled for Thursday, June 14, 2018 at 0920.        Pelvic/Bimanual No Pap smear completed today since last Pap smear was 09/23/2016. Pap smear not indicated per BCCCP guidelines.   Smoking History: Patient has never smoked.  Patient Navigation: Patient education provided. [facility]. Spanish interpreter provided.   Breast and Cervical Cancer Risk Assessment: Patient has a family history of a maternal aunt having breast cancer. Patient has no known genetic mutations or history of radiation treatment to the chest before age 89. Patient has no history of cervical dysplasia, immunocompromised, or DES exposure in-utero.  Risk Assessment    Risk Scores      06/14/2018   Last edited by: Lynnell DikeHolland, Sabrina H, LPN   5-year risk: 0.2 %   Lifetime risk: 6.5 %          Used Spanish  interpreter Natale LayErika McReynolds from [facility].

## 2018-06-15 ENCOUNTER — Encounter (HOSPITAL_COMMUNITY): Payer: Self-pay | Admitting: *Deleted
